# Patient Record
Sex: Female | Born: 2000 | Race: White | Hispanic: No | Marital: Single | Smoking: Never smoker
Health system: Southern US, Community
[De-identification: ages and names within clinical notes are randomized; demographics above are authoritative.]

---
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
## 2018-01-17 ENCOUNTER — Ambulatory Visit: Payer: BLUE CROSS/BLUE SHIELD | Admitting: Family Medicine

## 2018-02-07 ENCOUNTER — Ambulatory Visit (HOSPITAL_COMMUNITY)
Admission: EM | Admit: 2018-02-07 | Discharge: 2018-02-07 | Disposition: A | Discharge status: Home / Self Care | Payer: BLUE CROSS/BLUE SHIELD | Attending: Family Medicine | Admitting: Family Medicine

## 2018-02-07 ENCOUNTER — Encounter (HOSPITAL_COMMUNITY): Payer: Self-pay | Admitting: Family Medicine

## 2018-02-09 ENCOUNTER — Ambulatory Visit (HOSPITAL_COMMUNITY)
Admission: EM | Admit: 2018-02-09 | Discharge: 2018-02-09 | Disposition: A | Discharge status: Home / Self Care | Payer: BLUE CROSS/BLUE SHIELD | Attending: Family Medicine | Admitting: Family Medicine

## 2018-02-09 ENCOUNTER — Encounter (HOSPITAL_COMMUNITY): Payer: Self-pay | Admitting: Emergency Medicine

## 2018-02-09 ENCOUNTER — Other Ambulatory Visit: Payer: Self-pay

## 2018-02-09 DIAGNOSIS — K625 Hemorrhage of anus and rectum: Secondary | ICD-10-CM

## 2018-02-09 MED ORDER — POLYETHYLENE GLYCOL 3350 17 G PO PACK: 17.0000 g | PACK | Freq: Every day | ORAL | 0 refills | Status: DC

## 2018-02-09 NOTE — ED Provider Notes (Signed)
[facility]    CSN: [id number] Arrival date & time: 02/09/18  1712     History   Chief Complaint Chief Complaint  Patient presents with  . Rectal Bleeding    HPI Donna Oneill is a 17 y.o. female.   17 year old female comes in for bright red blood with stool.  States has had this happen in the past, but has always been intermittent.  She has seen bright red blood with bowel movements for the past 3 days consistently.  Denies blood without bowel movements.  Denies melena.  States blood usually upon wiping.  Denies abdominal pain, nausea, vomiting.  Denies fever, chills, night sweats.  Denies weakness, dizziness, syncope.  Does have hard stools, and mild pain with bowel movement.  No history of hemorrhoids.     History reviewed. No pertinent past medical history.  Patient Active Problem List   Diagnosis Date Noted  . Overweight (BMI 25.0-29.9) 06/26/2017  . Health education/counseling 06/26/2017  . Health maintenance examination 06/26/2017  . Poor diet 06/26/2017  . Inactivity 06/26/2017  . Dysmenorrhea 06/26/2017  . Menorrhalgia 06/26/2017  . Irregular menstrual cycle 06/26/2017  . Stress related to life events 06/26/2017    History reviewed. No pertinent surgical history.  OB History   None      Home Medications    Prior to Admission medications   Medication Sig Start Date End Date Taking? Authorizing Provider  polyethylene glycol (MIRALAX) packet Take 17 g by mouth daily. 02/09/18   Cathie HoopsYu, Xaidyn Kepner V, PA-C  Vitamin D, Ergocalciferol, (DRISDOL) 50000 units CAPS capsule Take 1 capsule (50,000 Units total) by mouth every 7 (seven) days. 07/20/17   Thomasene Lotpalski, Deborah, DO    Family History Family History  Problem Relation Age of Onset  . Healthy Mother   . Healthy Father     Social History Social History   Tobacco Use  . Smoking status: Never Smoker  . Smokeless tobacco: Never Used  Substance Use Topics  . Alcohol use: No    Frequency: Never  .  Drug use: No     Allergies   Patient has no known allergies.   Review of Systems Review of Systems  Reason unable to perform ROS: See HPI as above.     Physical Exam Triage Vital Signs ED Triage Vitals  Enc Vitals Group     BP 02/09/18 1754 115/66     Pulse Rate 02/09/18 1754 64     Resp 02/09/18 1754 16     Temp 02/09/18 1754 97.7 F (36.5 C)     Temp Source 02/09/18 1754 Oral     SpO2 02/09/18 1754 100 %     Weight --      Height --      Head Circumference --      Peak Flow --      Pain Score 02/09/18 1751 2     Pain Loc --      Pain Edu? --      Excl. in GC? --    No data found.  Updated Vital Signs BP 115/66 (BP Location: Right Arm)   Pulse 64   Temp 97.7 F (36.5 C) (Oral)   Resp 16   LMP 01/26/2018   SpO2 100%   Physical Exam  Constitutional: She is oriented to person, place, and time. She appears well-developed and well-nourished. No distress.  HENT:  Head: Normocephalic and atraumatic.  Eyes: Pupils are equal, round, and reactive to light. Conjunctivae are normal.  Cardiovascular: Normal rate, regular rhythm and normal heart sounds. Exam reveals no gallop and no friction rub.  No murmur heard. Pulmonary/Chest: Effort normal and breath sounds normal. She has no wheezes. She has no rales.  Abdominal: Soft. Bowel sounds are normal. She exhibits no mass. There is no tenderness. There is no rebound, no guarding and no CVA tenderness.  Genitourinary:  Genitourinary Comments: Chaperone present.  No external hemorrhoids seen.  No obvious internal hemorrhoids felt.  Rectal tone normal.  No anal fissure/tenderness.  Neurological: She is alert and oriented to person, place, and time.  Skin: Skin is warm and dry.  Psychiatric: She has a normal mood and affect. Her behavior is normal. Judgment normal.     UC Treatments / Results  Labs (all labs ordered are listed, but only abnormal results are displayed) Labs Reviewed - No data to  display  EKG None  Radiology No results found.  Procedures Procedures (including critical care time)  Medications Ordered in UC Medications - No data to display  Initial Impression / Assessment and Plan / UC Course  I have reviewed the triage vital signs and the nursing notes.  Pertinent labs & imaging results that were available during my care of the patient were reviewed by me and considered in my medical decision making (see chart for details).    Exam unremarkable.  Will have patient take MiraLAX to soften stool, and monitor bright red blood.  Sitz bath.  Information on rectal bleeding, hemorrhoids, sitz bath provided.  Return precautions given.  Otherwise follow-up with PCP for further evaluation if symptoms not improving after stool is soft.  Patient expresses understanding and agrees to plan.  Final Clinical Impressions(s) / UC Diagnoses   Final diagnoses:  Bright red blood per rectum    ED Prescriptions    Medication Sig Dispense Auth. Provider   polyethylene glycol (MIRALAX) packet Take 17 g by mouth daily. 30 each Dorena CookeyYu, Merit Gadsby V, PA-C        Festus Pursel V, PA-C 02/09/18 [phone]

## 2018-02-09 NOTE — Discharge Instructions (Signed)
No alarming signs on exam.  Start MiraLAX as directed to help with bowel movement. Keep hydrated, your urine should be clear to pale yellow in color.  Sitz bath as directed.  If symptoms resolves after bowel movements are soft, can continue MiraLAX to help with further symptoms.  If continues to have bright red blood with bowel movement, follow-up with primary care for further evaluation.  If experiencing worsening symptoms, significantly more blood causing weakness, dizziness, black tarry stools, nausea, vomiting, abdominal pain, go to the emergency department for further evaluation.

## 2018-02-09 NOTE — ED Triage Notes (Signed)
History of the same, blood in stool.  Seeing blood in stool Monday, Tuesday and today.  Blood is bright red.  Stool has been a little harder than usual, but not significantly hard

## 2018-02-14 ENCOUNTER — Encounter: Payer: Self-pay | Admitting: Family Medicine

## 2018-02-14 ENCOUNTER — Ambulatory Visit (INDEPENDENT_AMBULATORY_CARE_PROVIDER_SITE_OTHER): Payer: BLUE CROSS/BLUE SHIELD | Admitting: Family Medicine

## 2018-02-14 VITALS — BP 109/72 | HR 81 | Ht 67.0 in | Wt 161.0 lb

## 2018-02-14 DIAGNOSIS — F439 Reaction to severe stress, unspecified: Secondary | ICD-10-CM | POA: Diagnosis not present

## 2018-02-14 DIAGNOSIS — N946 Dysmenorrhea, unspecified: Secondary | ICD-10-CM

## 2018-02-14 DIAGNOSIS — E559 Vitamin D deficiency, unspecified: Secondary | ICD-10-CM | POA: Diagnosis not present

## 2018-02-14 DIAGNOSIS — E639 Nutritional deficiency, unspecified: Secondary | ICD-10-CM

## 2018-02-14 DIAGNOSIS — Z723 Lack of physical exercise: Secondary | ICD-10-CM

## 2018-02-14 DIAGNOSIS — Z719 Counseling, unspecified: Secondary | ICD-10-CM

## 2018-02-14 DIAGNOSIS — N926 Irregular menstruation, unspecified: Secondary | ICD-10-CM

## 2018-02-14 NOTE — Patient Instructions (Addendum)
Please realize, EXERCISE IS MEDICINE!  -  American Heart Association [facility]) guidelines for exercise : If you are in good health, without any medical conditions, you should engage in 150 minutes of moderate intensity aerobic activity per week.  This means you should be huffing and puffing throughout your workout.   Engaging in regular exercise will improve brain function and memory, as well as improve mood, boost immune system and help with weight management.  As well as the other, more well-known effects of exercise such as decreasing blood sugar levels, decreasing blood pressure,  and decreasing bad cholesterol levels/ increasing good cholesterol levels.     -  The AHA strongly endorses consumption of a diet that contains a variety of foods from all the food categories with an emphasis on fruits and vegetables; fat-free and low-fat dairy products; cereal and grain products; legumes and nuts; and fish, poultry, and/or extra lean meats.    Excessive food intake, especially of foods high in saturated and trans fats, sugar, and salt, should be avoided.    Adequate water intake of roughly 1/2 of your weight in pounds, should equal the ounces of water per day you should drink.  So for instance, if you're 200 pounds, that would be 100 ounces of water per day.         Mediterranean Diet  Why follow it? Research shows. . Those who follow the Mediterranean diet have a reduced risk of heart disease  . The diet is associated with a reduced incidence of Parkinson's and Alzheimer's diseases . People following the diet may have longer life expectancies and lower rates of chronic diseases  . The Dietary Guidelines for Americans recommends the Mediterranean diet as an eating plan to promote health and prevent disease  What Is the Mediterranean Diet?  . Healthy eating plan based on typical foods and recipes of Mediterranean-style cooking . The diet is primarily a plant based diet; these foods should make up a  majority of meals   Starches - Plant based foods should make up a majority of meals - They are an important sources of vitamins, minerals, energy, antioxidants, and fiber - Choose whole grains, foods high in fiber and minimally processed items  - Typical grain sources include wheat, oats, barley, corn, brown rice, bulgar, farro, millet, polenta, couscous  - Various types of beans include chickpeas, lentils, fava beans, black beans, white beans   Fruits  Veggies - Large quantities of antioxidant rich fruits & veggies; 6 or more servings  - Vegetables can be eaten raw or lightly drizzled with oil and cooked  - Vegetables common to the traditional Mediterranean Diet include: artichokes, arugula, beets, broccoli, brussel sprouts, cabbage, carrots, celery, collard greens, cucumbers, eggplant, kale, leeks, lemons, lettuce, mushrooms, okra, onions, peas, peppers, potatoes, pumpkin, radishes, rutabaga, shallots, spinach, sweet potatoes, turnips, zucchini - Fruits common to the Mediterranean Diet include: apples, apricots, avocados, cherries, clementines, dates, figs, grapefruits, grapes, melons, nectarines, oranges, peaches, pears, pomegranates, strawberries, tangerines  Fats - Replace butter and margarine with healthy oils, such as olive oil, canola oil, and tahini  - Limit nuts to no more than a handful a day  - Nuts include walnuts, almonds, pecans, pistachios, pine nuts  - Limit or avoid candied, honey roasted or heavily salted nuts - Olives are central to the Mediterranean diet - can be eaten whole or used in a variety of dishes   Meats Protein - Limiting red meat: no more than a few times a month -  When eating red meat: choose lean cuts and keep the portion to the size of deck of cards - Eggs: approx. 0 to 4 times a week  - Fish and lean poultry: at least 2 a week  - Healthy protein sources include, chicken, turkey, lean beef, lamb - Increase intake of seafood such as tuna, salmon, trout,  mackerel, shrimp, scallops - Avoid or limit high fat processed meats such as sausage and bacon  Dairy - Include moderate amounts of low fat dairy products  - Focus on healthy dairy such as fat free yogurt, skim milk, low or reduced fat cheese - Limit dairy products higher in fat such as whole or 2% milk, cheese, ice cream  Alcohol - Moderate amounts of red wine is ok  - No more than 5 oz daily for women (all ages) and men older than age 65  - No more than 10 oz of wine daily for men younger than 65  Other - Limit sweets and other desserts  - Use herbs and spices instead of salt to flavor foods  - Herbs and spices common to the traditional Mediterranean Diet include: basil, bay leaves, chives, cloves, cumin, fennel, garlic, lavender, marjoram, mint, oregano, parsley, pepper, rosemary, sage, savory, sumac, tarragon, thyme   It's not just a diet, it's a lifestyle:  . The Mediterranean diet includes lifestyle factors typical of those in the region  . Foods, drinks and meals are best eaten with others and savored . Daily physical activity is important for overall good health . This could be strenuous exercise like running and aerobics . This could also be more leisurely activities such as walking, housework, yard-work, or taking the stairs . Moderation is the key; a balanced and healthy diet accommodates most foods and drinks . Consider portion sizes and frequency of consumption of certain foods   Meal Ideas & Options:  . Breakfast:  o Whole wheat toast or whole wheat English muffins with peanut butter & hard boiled egg o Steel cut oats topped with apples & cinnamon and skim milk  o Fresh fruit: banana, strawberries, melon, berries, peaches  o Smoothies: strawberries, bananas, greek yogurt, peanut butter o Low fat greek yogurt with blueberries and granola  o Egg white omelet with spinach and mushrooms o Breakfast couscous: whole wheat couscous, apricots, skim milk, cranberries  . Sandwiches:   o Hummus and grilled vegetables (peppers, zucchini, squash) on whole wheat bread   o Grilled chicken on whole wheat pita with lettuce, tomatoes, cucumbers or tzatziki  o Tuna salad on whole wheat bread: tuna salad made with greek yogurt, olives, red peppers, capers, green onions o Garlic rosemary lamb pita: lamb sauted with garlic, rosemary, salt & pepper; add lettuce, cucumber, greek yogurt to pita - flavor with lemon juice and black pepper  . Seafood:  o Mediterranean grilled salmon, seasoned with garlic, basil, parsley, lemon juice and black pepper o Shrimp, lemon, and spinach whole-grain pasta salad made with low fat greek yogurt  o Seared scallops with lemon orzo  o Seared tuna steaks seasoned salt, pepper, coriander topped with tomato mixture of olives, tomatoes, olive oil, minced garlic, parsley, green onions and cappers  . Meats:  o Herbed greek chicken salad with kalamata olives, cucumber, feta  o Red bell peppers stuffed with spinach, bulgur, lean ground beef (or lentils) & topped with feta   o Kebabs: skewers of chicken, tomatoes, onions, zucchini, squash  o Turkey burgers: made with red onions, mint, dill, lemon juice, feta   cheese topped with roasted red peppers . Vegetarian o Cucumber salad: cucumbers, artichoke hearts, celery, red onion, feta cheese, tossed in olive oil & lemon juice  o Hummus and whole grain pita points with a greek salad (lettuce, tomato, feta, olives, cucumbers, red onion) o Lentil soup with celery, carrots made with vegetable broth, garlic, salt and pepper  o Tabouli salad: parsley, bulgur, mint, scallions, cucumbers, tomato, radishes, lemon juice, olive oil, salt and pepper.     Vitamin D Deficiency Vitamin D deficiency is when your body does not have enough vitamin D. Vitamin D is important to your body for many reasons:  It helps the body to absorb two important minerals, called calcium and phosphorus.  It plays a role in bone health.  It may  help to prevent some diseases, such as diabetes and multiple sclerosis.  It plays a role in muscle function, including heart function.  You can get vitamin D by:  Eating foods that naturally contain vitamin D.  Eating or drinking milk or other dairy products that have vitamin D added to them.  Taking a vitamin D supplement or a multivitamin supplement that contains vitamin D.  Being in the sun. Your body naturally makes vitamin D when your skin is exposed to sunlight. Your body changes the sunlight into a form of the vitamin that the body can use.  If vitamin D deficiency is severe, it can cause a condition in which your bones become soft. In adults, this condition is called osteomalacia. In children, this condition is called rickets. What are the causes? Vitamin D deficiency may be caused by:  Not eating enough foods that contain vitamin D.  Not getting enough sun exposure.  Having certain digestive system diseases that make it difficult for your body to absorb vitamin D. These diseases include Crohn disease, chronic pancreatitis, and cystic fibrosis.  Having a surgery in which a part of the stomach or a part of the small intestine is removed.  Being obese.  Having chronic kidney disease or liver disease.  What increases the risk? This condition is more likely to develop in:  Older people.  People who do not spend much time outdoors.  People who live in a long-term care facility.  People who have had broken bones.  People with weak or thin bones (osteoporosis).  People who have a disease or condition that changes how the body absorbs vitamin D.  People who have dark skin.  People who take certain medicines, such as steroid medicines or certain seizure medicines.  People who are overweight or obese.  What are the signs or symptoms? In mild cases of vitamin D deficiency, there may not be any symptoms. If the condition is severe, symptoms may include:  Bone  pain.  Muscle pain.  Falling often.  Broken bones caused by a minor injury.  How is this diagnosed? This condition is usually diagnosed with a blood test. How is this treated? Treatment for this condition may depend on what caused the condition. Treatment options include:  Taking vitamin D supplements.  Taking a calcium supplement. Your health care provider will suggest what dose is best for you.  Follow these instructions at home:  Take medicines and supplements only as told by your health care provider.  Eat foods that contain vitamin D. Choices include: ? Fortified dairy products, cereals, or juices. Fortified means that vitamin D has been added to the food. Check the label on the package to be sure. ? Fatty fish,  such as salmon or trout. ? Eggs. ? Oysters.  Do not use a tanning bed.  Maintain a healthy weight. Lose weight, if needed.  Keep all follow-up visits as told by your health care provider. This is important. Contact a health care provider if:  Your symptoms do not go away.  You feel like throwing up (nausea) or you throw up (vomit).  You have fewer bowel movements than usual or it is difficult for you to have a bowel movement (constipation). This information is not intended to replace advice given to you by your health care provider. Make sure you discuss any questions you have with your health care provider. Document Released: 08/31/2011 Document Revised: 11/20/2015 Document Reviewed: 10/24/2014 Elsevier Interactive Patient Education  2018 [employer]

## 2018-02-14 NOTE — Progress Notes (Signed)
Impression and Recommendations:    1. Vitamin D deficiency   2. Stress related to life events   3. Irregular menstrual cycle   4. Dysmenorrhea   5. Inactivity   6. Poor diet   7. Health education/counseling    Vitamin D -No change to treatment; will check levels today in OV -Discussed alternative dosages, pt prefers twice weekly if blood levels are low  Irregular Menstrual Cycle/Dysmenorrhea -Improved since last appointment with stress reduction through diet and exercise -Encouraged pt to continue to aim for 150 min of exercise per week  Stress -Stress has improved wth pt exercising. Also improved job and has been dating new Chiropodistparter for 2 months. Has been feeling more satisfied with her life. -Encouraged pt to continue increasing daily exercise to [facility] standards Please realize, EXERCISE IS MEDICINE! - American Heart Association [facility]( AHA) guidelines for exercise : you should engage in 150 minutes of moderate intensity aerobic activity per week.  - Engaging in regular exercise will improve brain function and memory, as well as improve mood, boost immune system and help with weight management.    Inactivity/Poor Diet -Improved since last appointment with support from friends and personal lifestyle changes -Has improved with overall stress reduction    Orders Placed This Encounter  Procedures  . VITAMIN D 25 Hydroxy (Vit-D Deficiency, Fractures)    No orders of the defined types were placed in this encounter.   Medications Discontinued During This Encounter  Medication Reason  . polyethylene glycol (MIRALAX) packet Patient Preference     Gross side effects, risk and benefits, and alternatives of medications and treatment plan in general discussed with patient.  Patient is aware that all medications have potential side effects and we are unable to predict every side effect or drug-drug interaction that may occur.   Patient will call with any questions prior to using  medication if they have concerns.  Expresses verbal understanding and consents to current therapy and treatment regimen.  No barriers to understanding were identified.  Red flag symptoms and signs discussed in detail.  Patient expressed understanding regarding what to do in case of emergency\urgent symptoms  Please see AVS handed out to patient at the end of our visit for further patient instructions/ counseling done pertaining to today's office visit.   Return for 6-12 mo f/up or sooner if problems/ concerns.    Note:  This note was prepared with assistance of Dragon voice recognition software. Occasional wrong-word or sound-a-like substitutions may have occurred due to the inherent limitations of voice recognition software.    I have reviewed the above documentation for accuracy and completeness, and I agree with the above.    This document serves as a record of services personally performed by Thomasene Loteborah Vada Swift, MD. It was created on her behalf by Alphonse GuildLauren Pearson, a trained medical scribe. The creation of this record is based on the scribe's personal observations and the provider's statements to them.   I have reviewed the above medical documentation for accuracy and completeness and I concur.  Thomasene LotDeborah Silvano Garofano 02/15/18 9:33 PM   --------------------------------------------------------------------------------------------------------------------------------------------------------------------------------------------------------------------------------------------    Subjective:     HPI: Donna Oneill is a 17 y.o. female [facility] issues as discussed below.  Vitamin D -States she has been feeling much better with regular use of Vitamin D -States she hasn't been as drowsy and fatigued, no longer needs naps every day  Lifestyle -States she  has been speed walking in her neighborhood  -Also has been going to the gym with her  friend because she doesn't have a memebership -States she has been going to the gym 3 days a week for 15-30 minutes per times  -Has been dating partner for 2 months but is not sexually active -Is currently in school and working at [employer]  -States she tries to maintain a regular sleep schedule and has been sleeping much better  Mood -States her stress has been much better  -Says the summer and changes in her job have made her life a lot better  Menstrual Irregularity -States her menstrual irregularity and issues have been improving substantially -Believes that the lifestyle and diet changes have helped her periods become regular again    Wt Readings from Last 3 Encounters:  02/14/18 161 lb (73 kg) (88 %, Z= 1.19)*  07/20/17 170 lb 3.2 oz (77.2 kg) (93 %, Z= 1.45)*  06/24/17 172 lb 11.2 oz (78.3 kg) (93 %, Z= 1.50)*   * Growth percentiles are based on CDC (Girls, 2-20 Years) data.   BP Readings from Last 3 Encounters:  02/14/18 109/72  02/09/18 115/66  07/20/17 120/74   Pulse Readings from Last 3 Encounters:  02/14/18 81  02/09/18 64  07/20/17 62   BMI Readings from Last 3 Encounters:  02/14/18 25.22 kg/m (81 %, Z= 0.86)*  07/20/17 26.66 kg/m (87 %, Z= 1.14)*  06/24/17 27.05 kg/m (89 %, Z= 1.20)*   * Growth percentiles are based on CDC (Girls, 2-20 Years) data.     Patient Care Team    Relationship Specialty Notifications Start End  Thomasene Lot, DO PCP - General Family Medicine  05/10/17    Comment: Charlean Sanfilippo, MD  Pediatrics  05/10/17    Comment: Merged     Patient Active Problem List   Diagnosis Date Noted  . Overweight (BMI 25.0-29.9) 06/26/2017    Priority: Medium  . Vitamin D deficiency 02/14/2018  . Health education/counseling 06/26/2017  . Health maintenance examination 06/26/2017  . Poor diet 06/26/2017  . Inactivity 06/26/2017  . Dysmenorrhea 06/26/2017  . Menorrhalgia 06/26/2017  . Irregular menstrual cycle 06/26/2017  .  Stress related to life events 06/26/2017    Past Medical history, Surgical history, Family history, Social history, Allergies and Medications have been entered into the medical record, reviewed and changed as needed.    Current Meds  Medication Sig  . Vitamin D, Ergocalciferol, (DRISDOL) 50000 units CAPS capsule Take 1 capsule (50,000 Units total) by mouth every 7 (seven) days.    Allergies:  No Known Allergies   Review of Systems:  A fourteen system review of systems was performed and found to be positive as per HPI.   Objective:   Blood pressure 109/72, pulse 81, height 5\' 7"  (1.702 m), weight 161 lb (73 kg), last menstrual period 01/26/2018, SpO2 100 %. Body mass index is 25.22 kg/m. General:  Well Developed, well nourished, appropriate for stated age.  Neuro:  Alert and oriented,  extra-ocular muscles intact  HEENT:  Normocephalic, atraumatic, neck supple, no carotid bruits appreciated  Skin:  no gross rash, warm, pink. Cardiac:  RRR, S1 S2 Respiratory:  ECTA B/L and A/P, Not using accessory muscles, speaking in full sentences- unlabored. Vascular:  Ext warm, no cyanosis apprec.; cap RF less 2 sec. Psych:  No HI/SI, judgement and insight good, Euthymic mood. Full Affect.

## 2018-02-15 ENCOUNTER — Encounter: Payer: Self-pay | Admitting: Family Medicine

## 2018-02-15 LAB — VITAMIN D 25 HYDROXY (VIT D DEFICIENCY, FRACTURES): Vit D, 25-Hydroxy: 41.1 ng/mL (ref 30.0–100.0)

## 2018-10-25 ENCOUNTER — Other Ambulatory Visit: Payer: Self-pay | Admitting: Family Medicine

## 2018-10-25 DIAGNOSIS — E559 Vitamin D deficiency, unspecified: Secondary | ICD-10-CM

## 2018-10-31 ENCOUNTER — Other Ambulatory Visit: Payer: Self-pay

## 2018-10-31 ENCOUNTER — Ambulatory Visit: Payer: BLUE CROSS/BLUE SHIELD | Admitting: Family Medicine

## 2018-10-31 ENCOUNTER — Telehealth: Payer: Self-pay | Admitting: Family Medicine

## 2018-10-31 NOTE — Telephone Encounter (Signed)
Patient called states she was unable to make to Telehealth call visit & says issue had resolved itself but thinks maybe a Gyn referral would be better for her issue.  - Forwarding message to medical assistant for referral review.  -glh

## 2018-11-01 ENCOUNTER — Other Ambulatory Visit: Payer: Self-pay

## 2018-11-01 DIAGNOSIS — N644 Mastodynia: Secondary | ICD-10-CM

## 2018-11-01 DIAGNOSIS — N926 Irregular menstruation, unspecified: Secondary | ICD-10-CM

## 2018-11-01 NOTE — Progress Notes (Signed)
Patient is requesting a referral for GYN due to breast discomfort and abnormal periods.  Patient offered to r/s visit with Dr. Sharee Holster but she prefers to go to GYN for issues.  Referral verbal approved by Dr. Sharee Holster

## 2018-11-01 NOTE — Telephone Encounter (Signed)
Referral placed. MPulliam, CMA/RT(R)  

## 2018-11-09 ENCOUNTER — Telehealth: Payer: Self-pay | Admitting: Obstetrics & Gynecology

## 2018-11-09 NOTE — Telephone Encounter (Signed)
Called and left a message for patient to call back to schedule a new patient doctor referral appointment with our office to see M. Leda Quail, MD re: abnormal menstruation and breast pain.

## 2018-11-16 NOTE — Telephone Encounter (Signed)
Unable to leave message for patient to call back to schedule a new patient doctor referral appointment with our office to see M. Leda Quail, MD re: abnormal menstruation and breast pain.

## 2018-11-21 NOTE — Telephone Encounter (Signed)
Called and left a message for patient to call back to schedule a new patient doctor referral appointment with our office to see M. Suzanne Miller, MD re: abnormal menstruation and breast pain. ° °

## 2020-05-14 ENCOUNTER — Encounter: Payer: Self-pay | Admitting: Emergency Medicine

## 2020-05-14 ENCOUNTER — Ambulatory Visit
Admission: EM | Admit: 2020-05-14 | Discharge: 2020-05-14 | Disposition: A | Discharge status: Home / Self Care | Payer: BC Managed Care – PPO | Attending: Emergency Medicine | Admitting: Emergency Medicine

## 2020-05-14 DIAGNOSIS — N3001 Acute cystitis with hematuria: Secondary | ICD-10-CM | POA: Diagnosis not present

## 2020-05-14 LAB — POCT URINALYSIS DIP (MANUAL ENTRY)
Glucose, UA: NEGATIVE mg/dL
Nitrite, UA: POSITIVE — AB
Protein Ur, POC: 100 mg/dL — AB
Spec Grav, UA: >=1.03 — AB (ref 1.010–1.025)
Urobilinogen, UA: 0.2 E.U./dL
pH, UA: 6 (ref 5.0–8.0)

## 2020-05-14 LAB — POCT URINE PREGNANCY: Preg Test, Ur: NEGATIVE

## 2020-05-14 MED ORDER — NITROFURANTOIN MONOHYD MACRO 100 MG PO CAPS: 100.0000 mg | ORAL_CAPSULE | Freq: Two times a day (BID) | ORAL | 0 refills | Status: AC

## 2020-05-14 NOTE — Discharge Instructions (Signed)
Your urine is consistent with UTI.   Complete course of antibiotics.  I have sent your urine to be cultured to confirm appropriate antibiotic, we would call you if any changes/ adjustments are necessary.  Drink plenty of water to empty bladder regularly. Avoid alcohol and caffeine as these may irritate the bladder.   Return for any worsening of symptoms.

## 2020-05-14 NOTE — ED Triage Notes (Signed)
Pt reports dysuria and hematuria that started this morning.

## 2020-05-14 NOTE — ED Provider Notes (Signed)
[facility]    CSN: [id number] Arrival date & time: 05/14/20  1051      History   Chief Complaint Chief Complaint  Patient presents with   Dysuria    HPI Donna Oneill is a 19 y.o. female.   Donna Oneill presents with complaints of urinary frequency, burning with urination, and hematuria which started today. No fevers. No back pain. No vaginal symptoms. No nausea vomiting or diarrhea. Denies any previous similar. LMP 11/3.    ROS per HPI, negative if not otherwise mentioned.      History reviewed. No pertinent past medical history.  Patient Active Problem List   Diagnosis Date Noted   Vitamin D deficiency 02/14/2018   Overweight (BMI 25.0-29.9) 06/26/2017   Health education/counseling 06/26/2017   Health maintenance examination 06/26/2017   Poor diet 06/26/2017   Inactivity 06/26/2017   Dysmenorrhea 06/26/2017   Menorrhalgia 06/26/2017   Irregular menstrual cycle 06/26/2017   Stress related to life events 06/26/2017    History reviewed. No pertinent surgical history.  OB History   No obstetric history on file.      Home Medications    Prior to Admission medications   Medication Sig Start Date End Date Taking? Authorizing Provider  nitrofurantoin, macrocrystal-monohydrate, (MACROBID) 100 MG capsule Take 1 capsule (100 mg total) by mouth 2 (two) times daily for 5 days. 05/14/20 05/19/20  Georgetta Haber, NP  Vitamin D, Ergocalciferol, (DRISDOL) 1.25 MG (50000 UT) CAPS capsule TAKE 1 CAPSULE BY MOUTH EVERY 7 DAYS. 10/25/18   Thomasene Lot, DO    Family History Family History  Problem Relation Age of Onset   Healthy Mother    Healthy Father     Social History Social History   Tobacco Use   Smoking status: Never Smoker   Smokeless tobacco: Never Used  Building services engineer Use: Never used  Substance Use Topics   Alcohol use: No   Drug use: No     Allergies   Patient has no known allergies.   Review  of Systems Review of Systems   Physical Exam Triage Vital Signs ED Triage Vitals  Enc Vitals Group     BP 05/14/20 1059 109/69     Pulse Rate 05/14/20 1059 69     Resp 05/14/20 1059 16     Temp 05/14/20 1059 98.4 F (36.9 C)     Temp Source 05/14/20 1059 Oral     SpO2 05/14/20 1059 99 %     Weight --      Height --      Head Circumference --      Peak Flow --      Pain Score 05/14/20 1057 5     Pain Loc --      Pain Edu? --      Excl. in GC? --    No data found.  Updated Vital Signs BP 109/69 (BP Location: Left Arm)    Pulse 69    Temp 98.4 F (36.9 C) (Oral)    Resp 16    LMP 04/24/2020    SpO2 99%   Visual Acuity Right Eye Distance:   Left Eye Distance:   Bilateral Distance:    Right Eye Near:   Left Eye Near:    Bilateral Near:     Physical Exam Constitutional:      General: She is not in acute distress.    Appearance: She is well-developed.  Cardiovascular:     Rate  and Rhythm: Normal rate.  Pulmonary:     Effort: Pulmonary effort is normal.  Abdominal:     Tenderness: There is no abdominal tenderness. There is no right CVA tenderness or left CVA tenderness.  Skin:    General: Skin is warm and dry.  Neurological:     Mental Status: She is alert and oriented to person, place, and time.      UC Treatments / Results  Labs (all labs ordered are listed, but only abnormal results are displayed) Labs Reviewed  POCT URINALYSIS DIP (MANUAL ENTRY) - Abnormal; Notable for the following components:      Result Value   Color, UA red (*)    Clarity, UA cloudy (*)    Bilirubin, UA small (*)    Ketones, POC UA trace (5) (*)    Spec Grav, UA >=1.030 (*)    Blood, UA large (*)    Protein Ur, POC =100 (*)    Nitrite, UA Positive (*)    Leukocytes, UA Large (3+) (*)    All other components within normal limits  URINE CULTURE  POCT URINE PREGNANCY    EKG   Radiology No results found.  Procedures Procedures (including critical care  time)  Medications Ordered in UC Medications - No data to display  Initial Impression / Assessment and Plan / UC Course  I have reviewed the triage vital signs and the nursing notes.  Pertinent labs & imaging results that were available during my care of the patient were reviewed by me and considered in my medical decision making (see chart for details).     Urine with nitrite and leuks. Consistent with UTI with macrbid initiated. Culture pending. Return precautions provided. Patient verbalized understanding and agreeable to plan.   Final Clinical Impressions(s) / UC Diagnoses   Final diagnoses:  Acute cystitis with hematuria     Discharge Instructions     Your urine is consistent with UTI.   Complete course of antibiotics.  I have sent your urine to be cultured to confirm appropriate antibiotic, we would call you if any changes/ adjustments are necessary.  Drink plenty of water to empty bladder regularly. Avoid alcohol and caffeine as these may irritate the bladder.   Return for any worsening of symptoms.     ED Prescriptions    Medication Sig Dispense Auth. Provider   nitrofurantoin, macrocrystal-monohydrate, (MACROBID) 100 MG capsule Take 1 capsule (100 mg total) by mouth 2 (two) times daily for 5 days. 10 capsule Georgetta Haber, NP     PDMP not reviewed this encounter.   Georgetta Haber, NP 05/14/20 1244

## 2020-05-16 LAB — URINE CULTURE: Culture: >=100000 — AB

## 2020-08-09 ENCOUNTER — Encounter (HOSPITAL_COMMUNITY): Payer: Self-pay

## 2020-08-09 ENCOUNTER — Other Ambulatory Visit: Payer: Self-pay

## 2020-08-09 ENCOUNTER — Ambulatory Visit (HOSPITAL_COMMUNITY)
Admission: EM | Admit: 2020-08-09 | Discharge: 2020-08-09 | Disposition: A | Discharge status: Home / Self Care | Payer: BC Managed Care – PPO | Attending: Urgent Care | Admitting: Urgent Care

## 2020-08-09 DIAGNOSIS — R0789 Other chest pain: Secondary | ICD-10-CM

## 2020-08-09 NOTE — ED Triage Notes (Signed)
Pt in with c/o left CP that has been going on for 2 weeks now.  States the pain is worse in the morning and when she takes deep breathes  Denies radiation to arm, jaw, back,  Denies dizziness, N/V

## 2020-08-09 NOTE — ED Provider Notes (Signed)
[facility]   MRN: [id number] DOB: 2000-11-23  Subjective:   Donna Oneill is a 20 y.o. female presenting for 2-week history of persistent intermittent mid to left-sided chest pain.  Patient states the symptoms are worse at bedtime and in the morning when she wakes up.  Symptoms can be elicited with deep breath.  She is primarily concerned about her gallbladder, states that her mom had very similar symptoms when she had gallbladder issues in her 56s.  Wants to have this evaluated.  Denies fever, cough, shortness of breath, nausea, vomiting, abdominal pain, clay colored stools.  Denies history of diarrhea constipation.  No other medical history.  Denies smoking cigarettes or drinking alcohol.  Does not have a PCP.  No current facility-administered medications for this encounter.  Current Outpatient Medications:  Marland Kitchen  Vitamin D, Ergocalciferol, (DRISDOL) 1.25 MG (50000 UT) CAPS capsule, TAKE 1 CAPSULE BY MOUTH EVERY 7 DAYS., Disp: 12 capsule, Rfl: 10   No Known Allergies  History reviewed. No pertinent past medical history.   History reviewed. No pertinent surgical history.  Family History  Problem Relation Age of Onset  . Healthy Mother   . Gallstones Mother   . Healthy Father     Social History   Tobacco Use  . Smoking status: Never Smoker  . Smokeless tobacco: Never Used  Vaping Use  . Vaping Use: Never used  Substance Use Topics  . Alcohol use: No  . Drug use: No    ROS   Objective:   Vitals: BP 122/80   Pulse (!) 58   Temp 98.7 F (37.1 C)   Resp 18   LMP 08/04/2020 (Approximate)   SpO2 99%   Physical Exam Constitutional:      General: She is not in acute distress.    Appearance: Normal appearance. She is well-developed and normal weight. She is not ill-appearing, toxic-appearing or diaphoretic.  HENT:     Head: Normocephalic and atraumatic.     Right Ear: External ear normal.     Left Ear: External ear normal.     Nose: Nose normal.      Mouth/Throat:     Mouth: Mucous membranes are moist.     Pharynx: Oropharynx is clear.  Eyes:     General: No scleral icterus.    Extraocular Movements: Extraocular movements intact.     Pupils: Pupils are equal, round, and reactive to light.  Cardiovascular:     Rate and Rhythm: Normal rate and regular rhythm.     Heart sounds: Normal heart sounds. No murmur heard. No friction rub. No gallop.   Pulmonary:     Effort: Pulmonary effort is normal. No respiratory distress.     Breath sounds: Normal breath sounds. No stridor. No wheezing, rhonchi or rales.  Abdominal:     General: Bowel sounds are normal. There is no distension.     Palpations: Abdomen is soft. There is no mass.     Tenderness: There is no abdominal tenderness. There is no right CVA tenderness, left CVA tenderness, guarding or rebound.  Skin:    General: Skin is warm and dry.     Coloration: Skin is not pale.     Findings: No rash.  Neurological:     General: No focal deficit present.     Mental Status: She is alert and oriented to person, place, and time.  Psychiatric:        Mood and Affect: Mood normal.  Behavior: Behavior normal.        Thought Content: Thought content normal.        Judgment: Judgment normal.     ED ECG REPORT   Date: 08/09/2020  Rate: 59bpm  Rhythm: sinus bradycardia  QRS Axis: right  Intervals: normal  ST/T Wave abnormalities: normal  Conduction Disutrbances:none  Narrative Interpretation: Borderline sinus bradycardia 59 bpm with rightward axis deviation.  No acute findings.  No previous EKG available for comparison.  Old EKG Reviewed: none available  I have personally reviewed the EKG tracing and agree with the computerized printout as noted.   Assessment and Plan :   PDMP not reviewed this encounter.  1. Atypical chest pain     Patient works at [employer] and does eat there regularly.  Counseled that her symptoms are more consistent with acid reflux but patient  does not want treatment for this.  She is primarily concerned about her gallbladder and will try to pursue further work-up for this.  Discussed signs and symptoms of gallbladder disease and recommended she pursue follow-up with a new primary care provider, information given to her for this.  She declined any medications and a chest x-ray at our clinic today.  Recommended dietary modifications.  Counseled patient on potential for adverse effects with medications prescribed/recommended today, ER and return-to-clinic precautions discussed, patient verbalized understanding.    Wallis Bamberg, [place] 08/09/20 [phone]

## 2021-06-19 ENCOUNTER — Other Ambulatory Visit: Payer: Self-pay

## 2021-06-19 ENCOUNTER — Ambulatory Visit
Admission: EM | Admit: 2021-06-19 | Discharge: 2021-06-19 | Disposition: A | Discharge status: Home / Self Care | Payer: BC Managed Care – PPO | Attending: Physician Assistant | Admitting: Physician Assistant

## 2021-06-19 ENCOUNTER — Encounter: Payer: Self-pay | Admitting: Emergency Medicine

## 2021-06-19 DIAGNOSIS — R300 Dysuria: Secondary | ICD-10-CM | POA: Insufficient documentation

## 2021-06-19 DIAGNOSIS — N3000 Acute cystitis without hematuria: Secondary | ICD-10-CM | POA: Diagnosis not present

## 2021-06-19 LAB — POCT URINALYSIS DIP (MANUAL ENTRY)
Bilirubin, UA: NEGATIVE
Glucose, UA: NEGATIVE mg/dL
Nitrite, UA: NEGATIVE
Protein Ur, POC: 30 mg/dL — AB
Spec Grav, UA: 1.02 (ref 1.010–1.025)
Urobilinogen, UA: 0.2 E.U./dL
pH, UA: 7.5 (ref 5.0–8.0)

## 2021-06-19 MED ORDER — ACETAMINOPHEN 325 MG PO TABS
650.0000 mg | ORAL_TABLET | Freq: Once | ORAL | Status: AC
  Administered 2021-06-19: 15:00:00 650 mg via ORAL

## 2021-06-19 MED ORDER — NITROFURANTOIN MONOHYD MACRO 100 MG PO CAPS: 100.0000 mg | ORAL_CAPSULE | Freq: Two times a day (BID) | ORAL | 0 refills | Status: AC

## 2021-06-19 NOTE — ED Triage Notes (Signed)
Patient c/o dysuria, frequency, body chills, low back pain, fever since x 5 days.

## 2021-06-19 NOTE — ED Provider Notes (Signed)
[facility]    CSN: [id number] Arrival date & time: 06/19/21  1344      History   Chief Complaint Chief Complaint  Patient presents with   Dysuria    HPI Donna Oneill is a 20 y.o. female.   Patient here today for evaluation of dysuria, frequency, chills, low back pain and fever that started about 5 days ago and seems to be worsening. She denies any upper respiratory symptoms.   The history is provided by the patient.  Dysuria Associated symptoms: fever   Associated symptoms: no nausea and no vomiting    History reviewed. No pertinent past medical history.  Patient Active Problem List   Diagnosis Date Noted   Vitamin D deficiency 02/14/2018   Overweight (BMI 25.0-29.9) 06/26/2017   Health education/counseling 06/26/2017   Health maintenance examination 06/26/2017   Poor diet 06/26/2017   Inactivity 06/26/2017   Dysmenorrhea 06/26/2017   Menorrhalgia 06/26/2017   Irregular menstrual cycle 06/26/2017   Stress related to life events 06/26/2017    History reviewed. No pertinent surgical history.  OB History   No obstetric history on file.      Home Medications    Prior to Admission medications   Medication Sig Start Date End Date Taking? Authorizing Provider  nitrofurantoin, macrocrystal-monohydrate, (MACROBID) 100 MG capsule Take 1 capsule (100 mg total) by mouth 2 (two) times daily. 06/19/21  Yes Tomi Bamberger, PA-C  Vitamin D, Ergocalciferol, (DRISDOL) 1.25 MG (50000 UT) CAPS capsule TAKE 1 CAPSULE BY MOUTH EVERY 7 DAYS. 10/25/18   Thomasene Lot, DO    Family History Family History  Problem Relation Age of Onset   Healthy Mother    Gallstones Mother    Healthy Father     Social History Social History   Tobacco Use   Smoking status: Never   Smokeless tobacco: Never  Vaping Use   Vaping Use: Never used  Substance Use Topics   Alcohol use: No   Drug use: No     Allergies   Patient has no known allergies.   Review of  Systems Review of Systems  Constitutional:  Positive for chills and fever.  Eyes:  Negative for discharge and redness.  Respiratory:  Negative for shortness of breath.   Gastrointestinal:  Negative for nausea and vomiting.  Genitourinary:  Positive for dysuria and frequency.  Musculoskeletal:  Positive for back pain.    Physical Exam Triage Vital Signs ED Triage Vitals  Enc Vitals Group     BP 06/19/21 1443 (!) 121/59     Pulse Rate 06/19/21 1443 92     Resp 06/19/21 1443 18     Temp 06/19/21 1443 (!) 103.1 F (39.5 C)     Temp Source 06/19/21 1443 Oral     SpO2 06/19/21 1443 96 %     Weight 06/19/21 1444 171 lb (77.6 kg)     Height 06/19/21 1444 5\' 7"  (1.702 m)     Head Circumference --      Peak Flow --      Pain Score 06/19/21 1444 7     Pain Loc --      Pain Edu? --      Excl. in GC? --    No data found.  Updated Vital Signs BP (!) 121/59 (BP Location: Left Arm)    Pulse 92    Temp (!) 103.1 F (39.5 C) (Oral)    Resp 18    Ht 5\' 7"  (1.702 m)  Wt 171 lb (77.6 kg)    LMP 06/16/2021    SpO2 96%    BMI 26.78 kg/m     Physical Exam Vitals and nursing note reviewed.  Constitutional:      General: She is not in acute distress.    Appearance: Normal appearance. She is not ill-appearing.  HENT:     Head: Normocephalic and atraumatic.  Cardiovascular:     Rate and Rhythm: Normal rate and regular rhythm.     Heart sounds: Normal heart sounds. No murmur heard. Pulmonary:     Effort: Pulmonary effort is normal. No respiratory distress.     Breath sounds: Normal breath sounds. No wheezing, rhonchi or rales.  Neurological:     Mental Status: She is alert.     UC Treatments / Results  Labs (all labs ordered are listed, but only abnormal results are displayed) Labs Reviewed  POCT URINALYSIS DIP (MANUAL ENTRY) - Abnormal; Notable for the following components:      Result Value   Color, UA light yellow (*)    Clarity, UA cloudy (*)    Ketones, POC UA small (15) (*)     Blood, UA moderate (*)    Protein Ur, POC =30 (*)    Leukocytes, UA Large (3+) (*)    All other components within normal limits  URINE CULTURE    EKG   Radiology No results found.  Procedures Procedures (including critical care time)  Medications Ordered in UC Medications  acetaminophen (TYLENOL) tablet 650 mg (650 mg Oral Given 06/19/21 1454)    Initial Impression / Assessment and Plan / UC Course  I have reviewed the triage vital signs and the nursing notes.  Pertinent labs & imaging results that were available during my care of the patient were reviewed by me and considered in my medical decision making (see chart for details).    UA with obvious UTI-- some concern for pyleo. Will treat with antibiotics orally with very strict precaution to report to ED with any worsening or if symptoms do not start to improve over the next 24 hours. Patient expresses understanding.  Final Clinical Impressions(s) / UC Diagnoses   Final diagnoses:  Dysuria  Acute cystitis without hematuria   Discharge Instructions   None    ED Prescriptions     Medication Sig Dispense Auth. Provider   nitrofurantoin, macrocrystal-monohydrate, (MACROBID) 100 MG capsule Take 1 capsule (100 mg total) by mouth 2 (two) times daily. 10 capsule Francene Finders, PA-C      PDMP not reviewed this encounter.   Francene Finders, PA-C 06/19/21 1536

## 2021-06-21 LAB — URINE CULTURE: Culture: >=100000 — AB
